# Patient Record
Sex: Female | Born: 1965 | Race: White | Hispanic: No | Smoking: Current every day smoker
Health system: Southern US, Community
[De-identification: ages and names within clinical notes are randomized; demographics above are authoritative.]

## PROBLEM LIST (undated history)

## (undated) DIAGNOSIS — B0229 Other postherpetic nervous system involvement: Secondary | ICD-10-CM

## (undated) DIAGNOSIS — G8929 Other chronic pain: Secondary | ICD-10-CM

## (undated) DIAGNOSIS — R51 Headache: Secondary | ICD-10-CM

## (undated) DIAGNOSIS — B029 Zoster without complications: Secondary | ICD-10-CM

## (undated) HISTORY — PX: SINUS SURGERY WITH INSTATRAK: SHX5215

## (undated) HISTORY — DX: Other chronic pain: G89.29

## (undated) HISTORY — PX: TONSILLECTOMY: SUR1361

## (undated) HISTORY — DX: Other postherpetic nervous system involvement: B02.29

## (undated) HISTORY — DX: Zoster without complications: B02.9

## (undated) HISTORY — DX: Headache: R51

---

## 2002-03-12 ENCOUNTER — Encounter: Admission: RE | Admit: 2002-03-12 | Discharge: 2002-03-12 | Payer: Self-pay | Admitting: Unknown Physician Specialty

## 2002-03-12 ENCOUNTER — Encounter: Payer: Self-pay | Admitting: Unknown Physician Specialty

## 2008-06-06 IMAGING — CT A/P
3 of 8 series · 12 of 42 positions shown, 18 images · IV contrast (CONTRAST)
Comparison: NONE

CLINICAL DATA: Pelvic pain. 

CT ABDOMEN AND PELVIS WITHOUT AND WITH INTRAVENOUS AND FOLLOWING 
ORAL  CONTRAST
TECHNIQUE: Multiple axial 5-millimeter thick slices at 
5-millimeter intervals were obtained from the lung base through 
the pelvis following the intravenous administration of 100 cc of 
Optiray 350 at a rate of 3 cc per second.  Oral contrast was 
administered as well.  Arterial and venous phase imaging was 
obtained in the upper abdomen with delayed images obtained through 
the pelvis.

[Series 4: venous · axial · portal-venous · 0.68mm/px · z∈[+636,+896]mm · 5 of 80 slices shown, 10 images]
[im 14/80  soft-tissue]
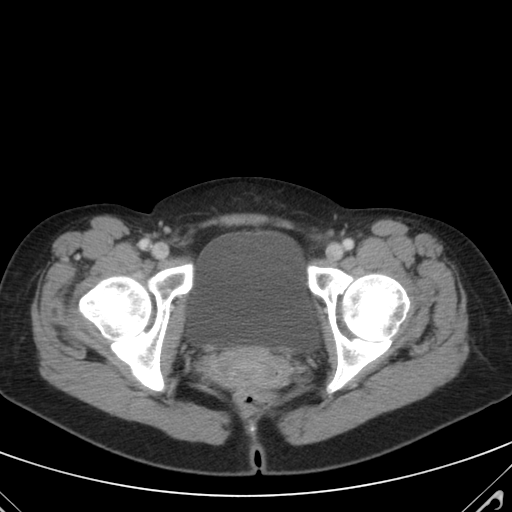
[im 14/80  bone]
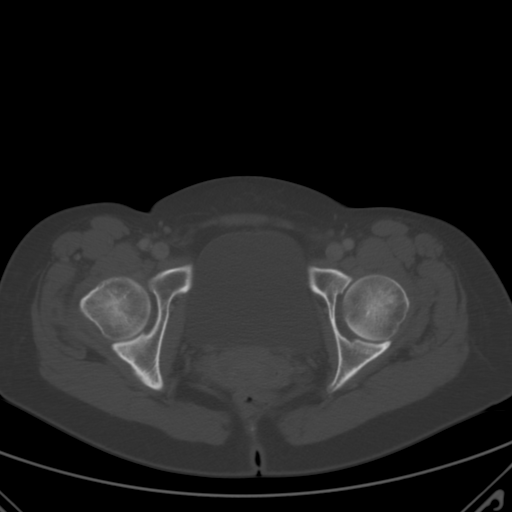
[im 27/80  soft-tissue]
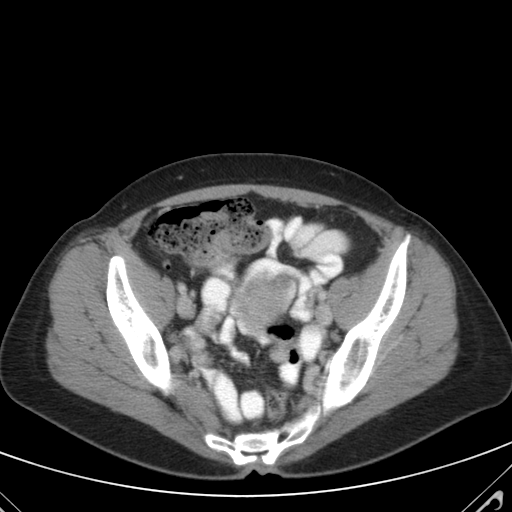
[im 27/80  lung]
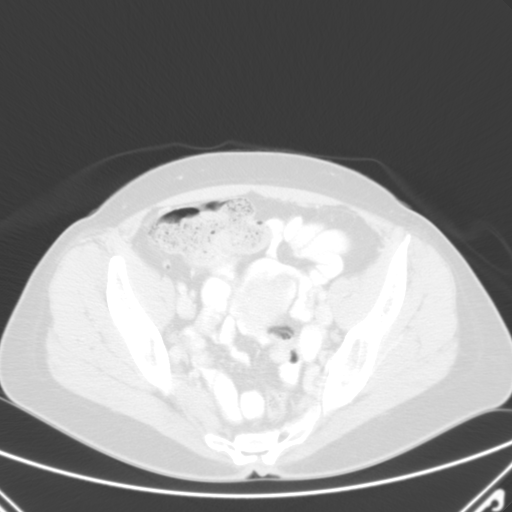
[im 40/80  soft-tissue]
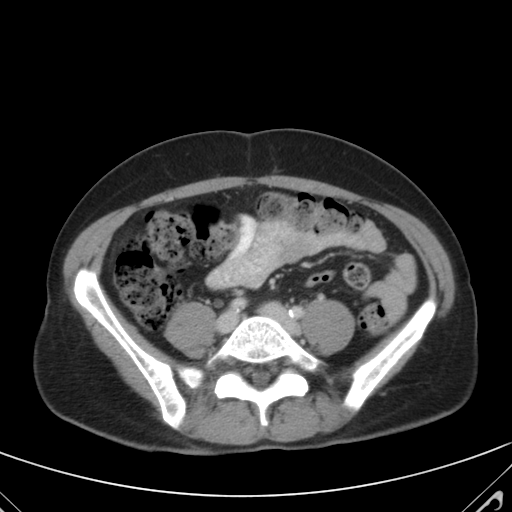
[im 40/80  lung]
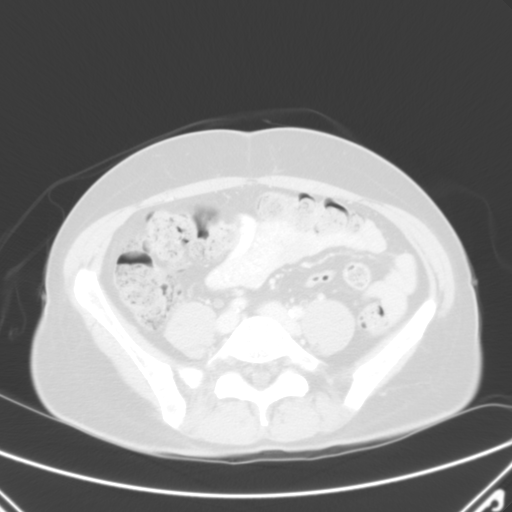
[im 53/80  soft-tissue]
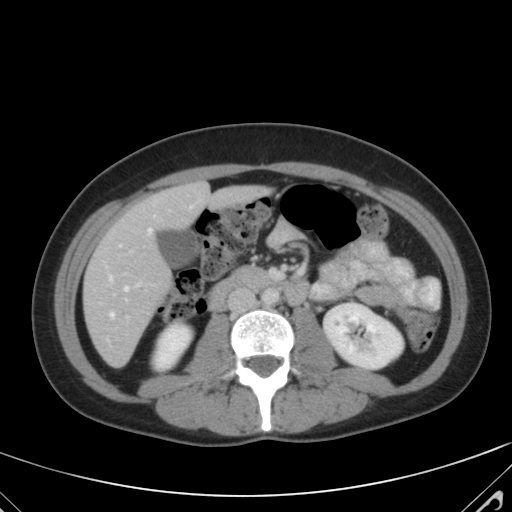
[im 53/80  lung]
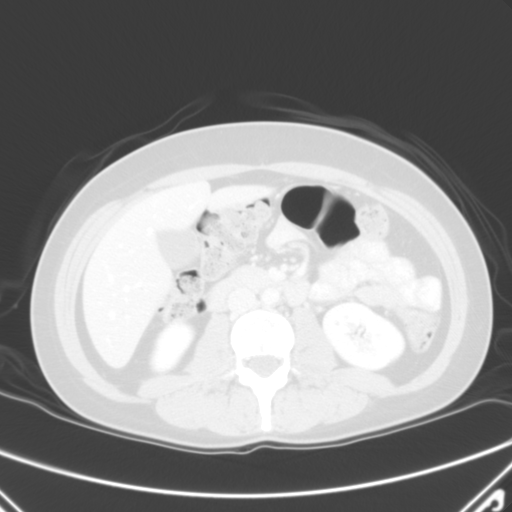
[im 66/80  soft-tissue]
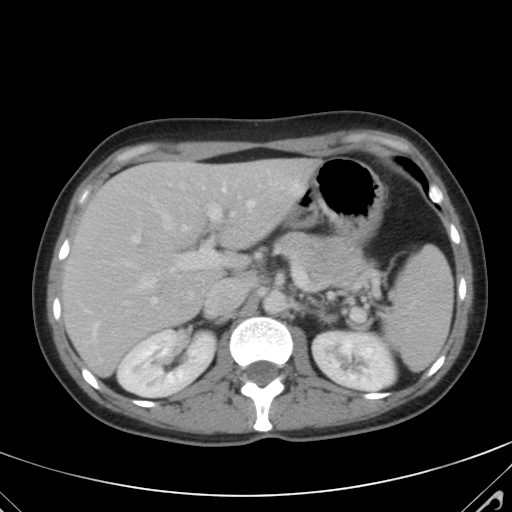
[im 66/80  lung]
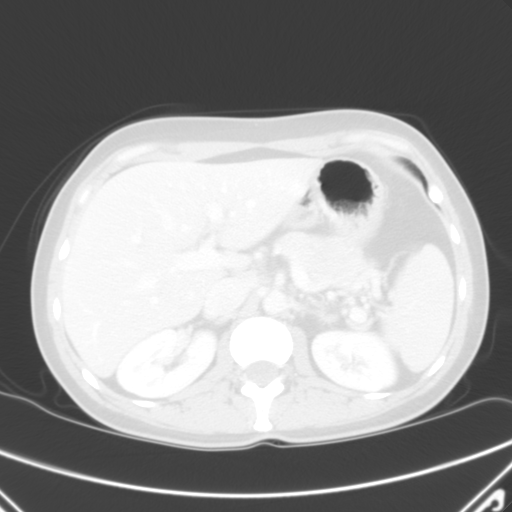

[Series 9: delays · axial · 0.68mm/px · z∈[+666,+861]mm · 4 of 67 slices shown]
[im 14/67  soft-tissue]
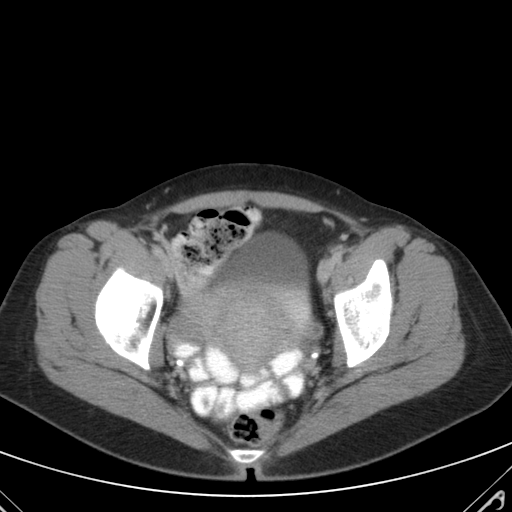
[im 27/67  soft-tissue]
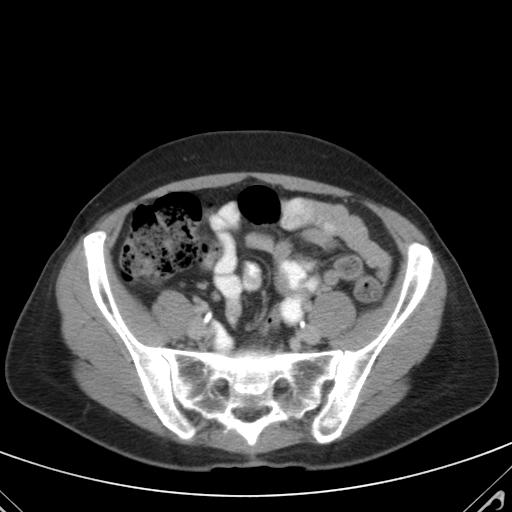
[im 40/67  soft-tissue]
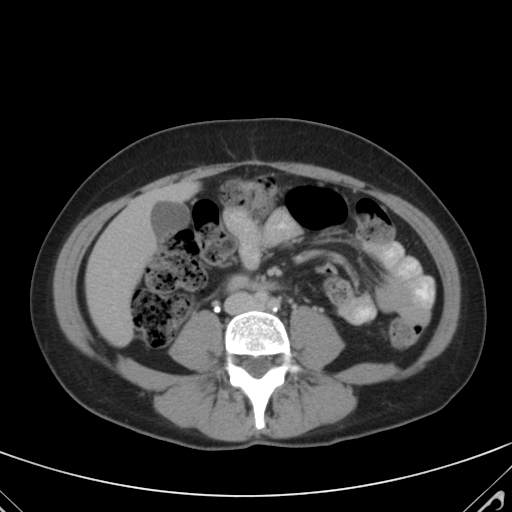
[im 53/67  soft-tissue]
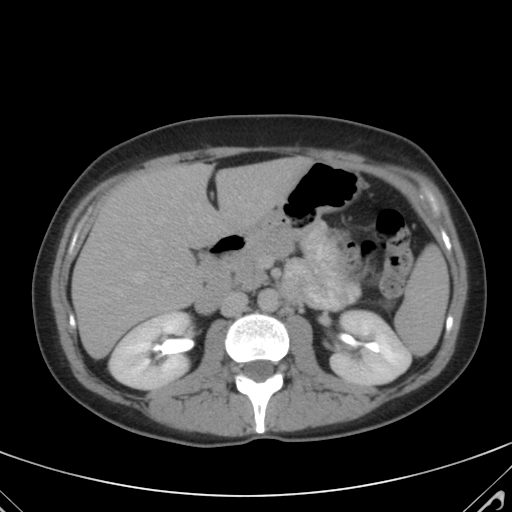

[Series 8058: coronals · coronal · 0.77mm/px · 3 of 74 slices shown, 4 images]
[im 25/74  soft-tissue]
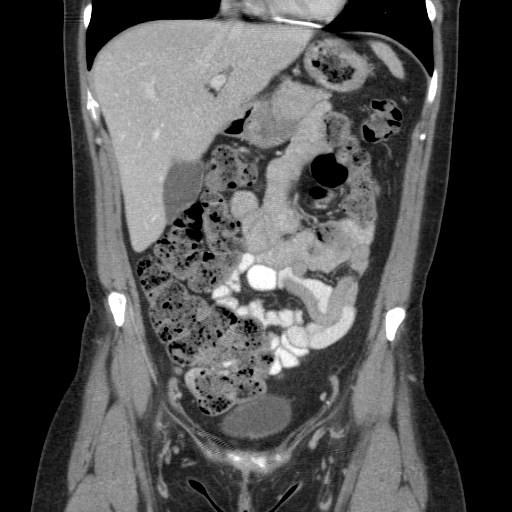
[im 33/74  soft-tissue]
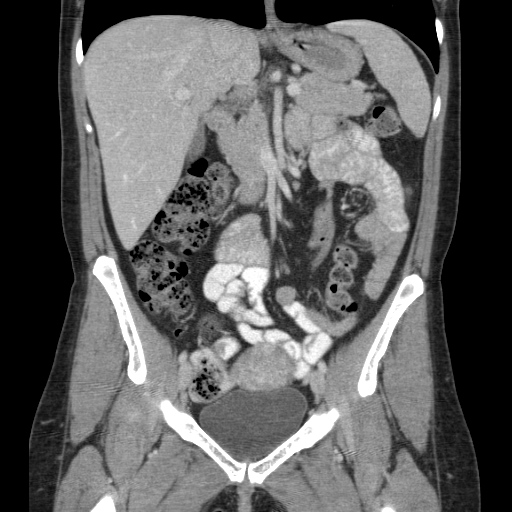
[im 33/74  bone]
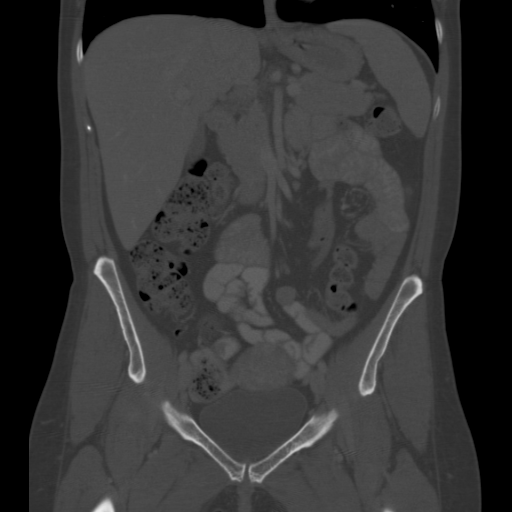
[im 41/74  soft-tissue]
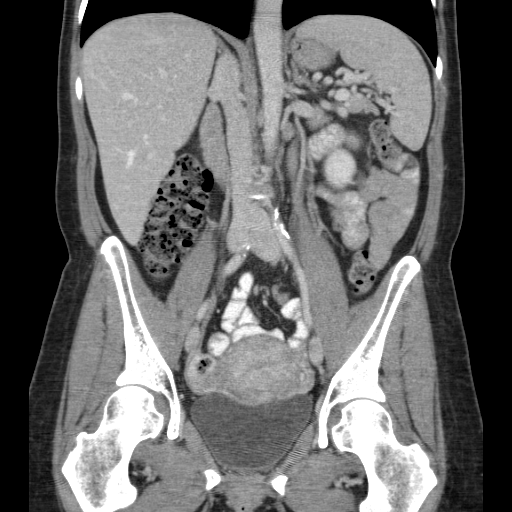

[12 of 42 positions shown; findings below may reference images not displayed]

FINDINGS: The heart size is within normal limits.  Visualized 
portions of the lung bases are within normal limits.  The liver, 
gallbladder, spleen, adrenal glands, pancreas, and kidneys are 
within normal limits.  There are no gross intraluminal masses seen 
in the bladder.  There is a hypodense mass seen in the left ovary. 
 This has a density of approximately 12 Hounsfield units.  This 
measures approximately 2.5 cm in diameter.  This is consistent 
with an ovarian cyst.  There are no gross intraluminal nodules 
seen.  Appendix demonstrates normal caliber and wall thickness. No 
free fluid.  No inflammatory changes.  No evidence of aortic 
aneurysm or abdominal hernia.  There is stool scattered throughout 
the colon.
IMPRESSION: Question constipation. Cystic left ovarian mass is 
seen.  This is likely a simple cyst.  I would consider a cystic 
neoplasm to be less likely.  I would recommend a follow-up pelvic 
ultrasound after 2 months or 2 menstrual cycles to ensure 
stability or resolution of the cyst. No inflammatory changes are 
seen in the abdomen or pelvis. Pubaltoni Jareci, M.D. Electronically 
02/07/2007 [facility]  [facility]

## 2012-12-27 ENCOUNTER — Emergency Department (HOSPITAL_COMMUNITY)
Admission: EM | Admit: 2012-12-27 | Discharge: 2012-12-27 | Disposition: A | Discharge status: Home / Self Care | Payer: Self-pay | Attending: Emergency Medicine | Admitting: Emergency Medicine

## 2012-12-27 ENCOUNTER — Emergency Department (HOSPITAL_COMMUNITY): Payer: Self-pay

## 2012-12-27 ENCOUNTER — Encounter (HOSPITAL_COMMUNITY): Payer: Self-pay | Admitting: Emergency Medicine

## 2012-12-27 DIAGNOSIS — Z79899 Other long term (current) drug therapy: Secondary | ICD-10-CM | POA: Insufficient documentation

## 2012-12-27 DIAGNOSIS — B0229 Other postherpetic nervous system involvement: Secondary | ICD-10-CM

## 2012-12-27 DIAGNOSIS — Z3202 Encounter for pregnancy test, result negative: Secondary | ICD-10-CM | POA: Insufficient documentation

## 2012-12-27 DIAGNOSIS — R079 Chest pain, unspecified: Secondary | ICD-10-CM | POA: Insufficient documentation

## 2012-12-27 DIAGNOSIS — B029 Zoster without complications: Secondary | ICD-10-CM | POA: Insufficient documentation

## 2012-12-27 LAB — CBC WITH DIFFERENTIAL/PLATELET
HCT: 42.5 % (ref 36.0–46.0)
Hemoglobin: 14.2 g/dL (ref 12.0–15.0)
Lymphocytes Relative: 26 % (ref 12–46)
Lymphs Abs: 1.6 10*3/uL (ref 0.7–4.0)
MCHC: 33.4 g/dL (ref 30.0–36.0)
Monocytes Absolute: 0.3 10*3/uL (ref 0.1–1.0)
Monocytes Relative: 5 % (ref 3–12)
Neutro Abs: 4.1 10*3/uL (ref 1.7–7.7)
WBC: 6.1 10*3/uL (ref 4.0–10.5)

## 2012-12-27 LAB — URINALYSIS, ROUTINE W REFLEX MICROSCOPIC
Bilirubin Urine: NEGATIVE
Glucose, UA: NEGATIVE mg/dL
Ketones, ur: NEGATIVE mg/dL
pH: 5 (ref 5.0–8.0)

## 2012-12-27 LAB — URINE MICROSCOPIC-ADD ON

## 2012-12-27 LAB — BASIC METABOLIC PANEL
BUN: 20 mg/dL (ref 6–23)
CO2: 24 mEq/L (ref 19–32)
Chloride: 102 mEq/L (ref 96–112)
Creatinine, Ser: 0.67 mg/dL (ref 0.50–1.10)
Glucose, Bld: 120 mg/dL — ABNORMAL HIGH (ref 70–99)

## 2012-12-27 MED ORDER — ONDANSETRON HCL 4 MG/2ML IJ SOLN
4.0000 mg | Freq: Once | INTRAMUSCULAR | Status: DC
  Filled 2012-12-27: qty 2

## 2012-12-27 MED ORDER — MORPHINE SULFATE 4 MG/ML IJ SOLN
4.0000 mg | Freq: Once | INTRAMUSCULAR | Status: DC
  Filled 2012-12-27: qty 1

## 2012-12-27 MED ORDER — HYDROCODONE-ACETAMINOPHEN 5-325 MG PO TABS
2.0000 | ORAL_TABLET | Freq: Once | ORAL | Status: AC
  Administered 2012-12-27: 2 via ORAL
  Filled 2012-12-27: qty 2

## 2012-12-27 MED ORDER — HYDROCODONE-ACETAMINOPHEN 5-325 MG PO TABS: 1.0000 | ORAL_TABLET | Freq: Four times a day (QID) | ORAL | Status: DC | PRN

## 2012-12-27 NOTE — ED Notes (Signed)
Pt c/o of right sided chest pain that started last night. States that she was diagnosed with shingles couple of weeks ago. PCP thought she should come here for concern with chest pain. Non radiating. Denies SOB dizziness, n/v/d.

## 2012-12-27 NOTE — ED Provider Notes (Signed)
History    CSN: [id number] Arrival date & time 12/27/12  1512  First MD Initiated Contact with Patient 12/27/12 [phone]     Chief Complaint  Patient presents with  . Chest Pain   (Consider location/radiation/quality/duration/timing/severity/associated sxs/prior Treatment) Patient is a 47 y.o. female presenting with chest pain. The history is provided by the patient and a relative.  Chest Pain Associated symptoms: no abdominal pain, no back pain, no fever, no headache and no shortness of breath   pt c/o left chest pain for the past several weeks. Constant. Dull, burning. Non radiating. Worse at night, at rest. No associated nv, diaphoresis or sob. Pain is not pleuritic. Pt states at onset pain/symptoms, was diagnosed w shingles in the same area. States the rash has resolved, but pain persists/constant. No acute or abrupt change in pain today, but states almost out of her ultram, which did not help pain in first place. Denies any exertional cp or discomfort.   Denies cough or sob. No abd pain. Denies leg pain or swelling, no immobility/travel/surgery, or hx dvt or pe. No family hx premature cad.    History reviewed. No pertinent past medical history. History reviewed. No pertinent past surgical history. No family history on file. History  Substance Use Topics  . Smoking status: Not on file  . Smokeless tobacco: Not on file  . Alcohol Use: Not on file   OB History   Grav Para Term Preterm Abortions TAB SAB Ect Mult Living                 Review of Systems  Constitutional: Negative for fever and chills.  HENT: Negative for neck pain.   Eyes: Negative for redness.  Respiratory: Negative for shortness of breath.   Cardiovascular: Positive for chest pain. Negative for leg swelling.  Gastrointestinal: Negative for abdominal pain.  Genitourinary: Negative for flank pain.  Musculoskeletal: Negative for back pain.  Skin: Negative for rash.  Neurological: Negative for headaches.   Hematological: Does not bruise/bleed easily.  Psychiatric/Behavioral: Negative for confusion.    Allergies  Avelox  Home Medications   Current Outpatient Rx  Name  Route  Sig  Dispense  Refill  . Aspirin-Caffeine (BC FAST PAIN RELIEF PO)   Oral   Take 1 packet by mouth every 4 (four) hours as needed (headache).         . diphenhydrAMINE (BENADRYL) 25 MG tablet   Oral   Take 25 mg by mouth at bedtime as needed for sleep.         Marland Kitchen LORazepam (ATIVAN) 0.5 MG tablet   Oral   Take 0.5 mg by mouth 2 (two) times daily as needed for anxiety.         . traMADol (ULTRAM) 50 MG tablet   Oral   Take 50 mg by mouth every 6 (six) hours as needed for pain.          BP 135/82  Pulse 74  Temp(Src) 98.4 F (36.9 C) (Oral)  Resp 14  SpO2 98% Physical Exam  Nursing note and vitals reviewed. Constitutional: She appears well-developed and well-nourished. No distress.  HENT:  Mouth/Throat: Oropharynx is clear and moist.  Eyes: Conjunctivae are normal. No scleral icterus.  Neck: Neck supple. No tracheal deviation present.  Cardiovascular: Normal rate, regular rhythm, normal heart sounds and intact distal pulses.  Exam reveals no gallop and no friction rub.   No murmur heard. Pulmonary/Chest: Effort normal and breath sounds normal. No respiratory distress. She  exhibits no tenderness.  No breast or axillary mass felt.   Abdominal: Soft. Normal appearance. She exhibits no distension. There is no tenderness.  Musculoskeletal: She exhibits no edema and no tenderness.  Neurological: She is alert.  Skin: Skin is warm and dry. No rash noted.  Psychiatric: She has a normal mood and affect.    ED Course  Procedures (including critical care time)  Results for orders placed during the hospital encounter of 12/27/12  TROPONIN I      Result Value Range   Troponin I <0.30  <0.30 ng/mL  CBC WITH DIFFERENTIAL      Result Value Range   WBC 6.1  4.0 - 10.5 K/uL   RBC 4.68  3.87 - 5.11  MIL/uL   Hemoglobin 14.2  12.0 - 15.0 g/dL   HCT 09.8  11.9 - 14.7 %   MCV 90.8  78.0 - 100.0 fL   MCH 30.3  26.0 - 34.0 pg   MCHC 33.4  30.0 - 36.0 g/dL   RDW 82.9  56.2 - 13.0 %   Platelets 198  150 - 400 K/uL   Neutrophils Relative % 67  43 - 77 %   Neutro Abs 4.1  1.7 - 7.7 K/uL   Lymphocytes Relative 26  12 - 46 %   Lymphs Abs 1.6  0.7 - 4.0 K/uL   Monocytes Relative 5  3 - 12 %   Monocytes Absolute 0.3  0.1 - 1.0 K/uL   Eosinophils Relative 1  0 - 5 %   Eosinophils Absolute 0.1  0.0 - 0.7 K/uL   Basophils Relative 1  0 - 1 %   Basophils Absolute 0.1  0.0 - 0.1 K/uL  BASIC METABOLIC PANEL      Result Value Range   Sodium 138  135 - 145 mEq/L   Potassium 3.7  3.5 - 5.1 mEq/L   Chloride 102  96 - 112 mEq/L   CO2 24  19 - 32 mEq/L   Glucose, Bld 120 (*) 70 - 99 mg/dL   BUN 20  6 - 23 mg/dL   Creatinine, Ser 8.65  0.50 - 1.10 mg/dL   Calcium 9.7  8.4 - 78.4 mg/dL   GFR calc non Af Amer >90  >90 mL/min   GFR calc Af Amer >90  >90 mL/min  URINALYSIS, ROUTINE W REFLEX MICROSCOPIC      Result Value Range   Color, Urine YELLOW  YELLOW   APPearance CLEAR  CLEAR   Specific Gravity, Urine 1.014  1.005 - 1.030   pH 5.0  5.0 - 8.0   Glucose, UA NEGATIVE  NEGATIVE mg/dL   Hgb urine dipstick SMALL (*) NEGATIVE   Bilirubin Urine NEGATIVE  NEGATIVE   Ketones, ur NEGATIVE  NEGATIVE mg/dL   Protein, ur NEGATIVE  NEGATIVE mg/dL   Urobilinogen, UA 0.2  0.0 - 1.0 mg/dL   Nitrite NEGATIVE  NEGATIVE   Leukocytes, UA NEGATIVE  NEGATIVE  URINE MICROSCOPIC-ADD ON      Result Value Range   Squamous Epithelial / LPF RARE  RARE   WBC, UA 0-2  <3 WBC/hpf   RBC / HPF 0-2  <3 RBC/hpf   Bacteria, UA RARE  RARE  POCT PREGNANCY, URINE      Result Value Range   Preg Test, Ur NEGATIVE  NEGATIVE   Dg Chest 2 View  12/27/2012   *RADIOLOGY REPORT*  Clinical Data: Chest pain  CHEST - 2 VIEW  Comparison: None.  Findings: Cardiomediastinal  silhouette is within normal limits. The lungs are clear. No  pleural effusion.  No pneumothorax.  No acute osseous abnormality.  IMPRESSION: Normal chest.   Original Report Authenticated By: Christiana Pellant, M.D.     MDM  Iv ns. Labs. Cxr.   Date: 12/27/2012  Rate: 62  Rhythm: normal sinus rhythm  QRS Axis: normal  Intervals: normal  ST/T Wave abnormalities: normal  Conduction Disutrbances:none  Narrative Interpretation:   Old EKG Reviewed: none available  Reviewed nursing notes and prior charts for additional history.   Morphine iv. zofran iv.   After constant symptoms at rest for several weeks, ecg, cxr, trop neg.  Symptoms do not appear c/w acs.  No fevers. No pleuritic pain or sob.  Pt reports onset symptoms along w dx shingles, feel most likely post shingles/neuralgia related pain.  Pt notes fam hx cad in mother, also expresses concern re pcp f/u/health maintenance/mammogram - discussed will give referral to pcp, and card.   Pt appears stable for d/c.      Suzi Roots, MD 12/27/12 [phone]

## 2013-01-01 ENCOUNTER — Telehealth (HOSPITAL_COMMUNITY): Payer: Self-pay | Admitting: *Deleted

## 2013-08-22 ENCOUNTER — Ambulatory Visit (INDEPENDENT_AMBULATORY_CARE_PROVIDER_SITE_OTHER): Payer: BC Managed Care – PPO | Admitting: Internal Medicine

## 2013-08-22 ENCOUNTER — Encounter: Payer: Self-pay | Admitting: Internal Medicine

## 2013-08-22 VITALS — BP 126/74 | HR 56 | Ht 65.0 in | Wt 134.2 lb

## 2013-08-22 DIAGNOSIS — R0789 Other chest pain: Secondary | ICD-10-CM

## 2013-08-22 DIAGNOSIS — F17200 Nicotine dependence, unspecified, uncomplicated: Secondary | ICD-10-CM

## 2013-08-22 DIAGNOSIS — J8410 Pulmonary fibrosis, unspecified: Secondary | ICD-10-CM

## 2013-08-22 NOTE — Patient Instructions (Signed)
The key is to stop smoking completely before smoking completely stops you - this is the most important aspect of your care - the e cigarettes are better than smoking  Ok to take off until Monday the 16th of Feb 2015   Try to build up the neurontin dose as per bottle instructions if you can tolerate  Please schedule a follow up office visit in 4 weeks, sooner if needed with cxr and pft then

## 2013-08-22 NOTE — Progress Notes (Signed)
   Subjective:    Patient ID: Audrey Johnston, female    DOB: Jan 21, 1966   MRN: [id number]  HPI  [age] yowf smoker with unexplained chest wall pain and mild PF on cxr referred to pulmonary clinic 08/22/13 by Dr Audrey Johnston  08/22/2013 1st [facility] office visit/ Audrey Johnston  Chief Complaint  Patient presents with  . Advice Only    Pt c/o burning in chest, especially L side X6 months.  Was dx'ed with pulm fibrosis by urgent care 2 weeks ago, wants to follow-up with pulmonary for this.    Onset was acute 6 m prior to OV  Assoc with post rash around T5/6distribution on L with burning and then spread to R side across midline anteriorly to the R breast and post cp on L resoved. This resulted in cxr suggesting PF.  However, pain in at all pleuritic and is assoc with hyperesthesia to point where can no longer wear a sports bra, better on neurontin but makes her too sleepy so only takes at night when the pain is much less severe. Not limited by breathing from desired activities    No obvious other patterns in day to day or daytime variabilty or assoc chronic cough  subjective wheeze overt sinus or hb symptoms. No unusual exp hx or h/o childhood pna/ asthma or knowledge of premature birth.  Sleeping ok without nocturnal  or early am exacerbation  of respiratory  c/o's or need for noct saba. Also denies any obvious fluctuation of symptoms with weather or environmental changes or other aggravating or alleviating factors except as outlined above   Current Medications, Allergies, Complete Past Medical History, Past Surgical History, Family History, and Social History were reviewed in [facility] electronic medical record.           Review of Systems  Constitutional: Negative for fever and unexpected weight change.  HENT: Positive for sinus pressure. Negative for congestion, dental problem, ear pain, nosebleeds, postnasal drip, rhinorrhea, sneezing, sore throat and trouble swallowing.   Eyes: Negative  for redness and itching.  Respiratory: Positive for chest tightness. Negative for cough, shortness of breath and wheezing.   Cardiovascular: Negative for palpitations and leg swelling.  Gastrointestinal: Negative for nausea and vomiting.  Genitourinary: Negative for dysuria.  Musculoskeletal: Negative for joint swelling.  Skin: Negative for rash.  Neurological: Positive for headaches.  Hematological: Does not bruise/bleed easily.  Psychiatric/Behavioral: Negative for dysphoric mood. The patient is nervous/anxious.        Objective:   Physical Exam Extremely anxious wf nad  HEENT: nl dentition, turbinates, and orophanx. Nl external ear canals without cough reflex   NECK :  without JVD/Nodes/TM/ nl carotid upstrokes bilaterally   LUNGS: no acc muscle use, clear to A and P bilaterally without cough on insp or exp maneuvers   CV:  RRR  no s3 or murmur or increase in P2, no edema   ABD:  soft and nontender with nl excursion in the supine position. No bruits or organomegaly, bowel sounds nl  MS:  warm without deformities, calf tenderness, cyanosis or clubbing  SKIN: warm and dry without lesions    NEURO:  alert, approp, no deficits       cxr 08/07/13 increased int markings, no baseline avail per rads notes but in EMR has cxr from 12/27/2012 that also shows slt prominent int markings    Assessment & Plan:

## 2013-08-23 ENCOUNTER — Encounter: Payer: Self-pay | Admitting: Internal Medicine

## 2013-08-23 DIAGNOSIS — J8410 Pulmonary fibrosis, unspecified: Secondary | ICD-10-CM | POA: Insufficient documentation

## 2013-08-23 DIAGNOSIS — R0789 Other chest pain: Secondary | ICD-10-CM | POA: Insufficient documentation

## 2013-08-23 DIAGNOSIS — F17200 Nicotine dependence, unspecified, uncomplicated: Secondary | ICD-10-CM | POA: Insufficient documentation

## 2013-08-23 NOTE — Assessment & Plan Note (Signed)
>   3 min discussion  I emphasized that although we never turn away smokers from the pulmonary clinic, we do ask that they understand that the recommendations that we make  won't work nearly as well in the presence of continued cigarette exposure.  In fact, we may very well  reach a point where we can't promise to help the patient if he/she can't quit smoking. (We can and will promise to try to help, we just can't promise what we recommend will really work)   Ok to use e cigarettes as not assoc with ILD to my knowledge, though would prefer alternative sources of nicotine or rx with chantix, defer to primary care

## 2013-08-23 NOTE — Assessment & Plan Note (Addendum)
Onset around oct 2014 with ? HZ rash > fit perfectly with post herpetic neuralgia except the original area of pain posteriorly resolved and now pain crosses over to the the R breast which doesn't fit anatomically with any dermatome but is clearly not a pulmonary issue.  rec titrate up neurontin as prev rec, perhaps using the 100 mg strength during the day until she develops cns tolerance (vs lyrica rx) but defer this to primary care's capable hands as I have no particular expertise in this area.

## 2013-08-23 NOTE — Assessment & Plan Note (Signed)
DDx for pulmonary fibrosis  includes idiopathic pulmonary fibrosis, pulmonary fibrosis associated with rheumatologic diseases (which have a relatively benign course in most cases) , adverse effect from  drugs such as chemotherapy or amiodarone exposure, nonspecific interstitial pneumonia which is typically steroid responsive, and chronic hypersensitivity pneumonitis.   In active  smokers Langerhan's Cell  Histiocyctosis (eosinophilic granuomatosis = EG),  DIP,  and Respiratory Bronchiolitis ILD also need to be considered,   Mostly likely this is EG or DIP or RBILD but can't rule out IPF -  She will need to stop smoking and regroup here in 6 weeks with pfts and comparison cxr and we'll go from there.

## 2013-09-23 ENCOUNTER — Other Ambulatory Visit: Payer: Self-pay | Admitting: Internal Medicine

## 2013-09-23 DIAGNOSIS — J8410 Pulmonary fibrosis, unspecified: Secondary | ICD-10-CM

## 2013-09-24 ENCOUNTER — Ambulatory Visit (INDEPENDENT_AMBULATORY_CARE_PROVIDER_SITE_OTHER): Payer: BC Managed Care – PPO | Admitting: Internal Medicine

## 2013-09-24 ENCOUNTER — Encounter: Payer: Self-pay | Admitting: Internal Medicine

## 2013-09-24 VITALS — BP 126/82 | HR 55 | Temp 98.2°F | Ht 65.0 in | Wt 136.0 lb

## 2013-09-24 DIAGNOSIS — R0789 Other chest pain: Secondary | ICD-10-CM

## 2013-09-24 DIAGNOSIS — J4489 Other specified chronic obstructive pulmonary disease: Secondary | ICD-10-CM

## 2013-09-24 DIAGNOSIS — J449 Chronic obstructive pulmonary disease, unspecified: Secondary | ICD-10-CM | POA: Insufficient documentation

## 2013-09-24 DIAGNOSIS — J8410 Pulmonary fibrosis, unspecified: Secondary | ICD-10-CM

## 2013-09-24 MED ORDER — PREGABALIN 100 MG PO CAPS: 100.0000 mg | ORAL_CAPSULE | Freq: Two times a day (BID) | ORAL | Status: DC

## 2013-09-24 NOTE — Patient Instructions (Addendum)
Zostrix cream 4 x daily to the area that hurts the worst  Stop gabapentin and start lyrica 100 mg twice daily x one month  And you will need referral to a pain specialist if not better   The key is to stop smoking completely before smoking completely stops you - it's cleary not too late

## 2013-09-24 NOTE — Progress Notes (Signed)
PFT done today. 

## 2013-09-24 NOTE — Progress Notes (Signed)
Subjective:    Patient ID: Audrey Johnston, female    DOB: 03/28/1966   MRN: [id number]    Brief patient profile:  [age] yowf smoker with unexplained chest wall pain and mild PF on cxr referred to pulmonary clinic 08/22/13 by Dr Gillis Endsobert Brown   History of Present Illness  08/22/2013 1st [facility] office visit/ Wert  Chief Complaint  Patient presents with  . Advice Only    Pt c/o burning in chest, especially L side X6 months.  Was dx'ed with pulm fibrosis by urgent care 2 weeks ago, wants to follow-up with pulmonary for this.    Onset was acute 6 m prior to OV  Assoc with post rash around T5/6distribution on L with burning and then spread to R side across midline anteriorly to the R breast and post cp on L resoved. This resulted in cxr suggesting PF.  However, pain in at all pleuritic and is assoc with hyperesthesia to point where can no longer wear a sports bra, better on neurontin but makes her too sleepy so only takes at night when the pain is much less severe. Not limited by breathing from desired activities. rec The key is to stop smoking completely before smoking completely stops you - this is the most important aspect of your care - the e cigarettes are better than smoking Ok to take off until Monday the 16th of Feb 2015  Try to build up the neurontin dose as per bottle instructions if you can tolerate Please schedule a follow up office visit in 4 weeks, sooner if needed with cxr and pft then   09/24/2013 f/u ov/Wert re: neurontin 300 bid / still smoking  Chief Complaint  Patient presents with  . Follow-up    With PFT results. Breathing is unchanged. Denies SOB and cough. Pt has "CP d/t shingles", pt had shingles July 2014.    neurontin helps the pain but can't tol max dose due to drowsiness Not limited by breathing from desired activities    No obvious day to day or daytime variabilty or assoc chronic cough or cp or chest tightness, subjective wheeze overt sinus or hb  symptoms. No unusual exp hx or h/o childhood pna/ asthma or knowledge of premature birth.  Sleeping ok without nocturnal  or early am exacerbation  of respiratory  c/o's or need for noct saba. Also denies any obvious fluctuation of symptoms with weather or environmental changes or other aggravating or alleviating factors except as outlined above   Current Medications, Allergies, Complete Past Medical History, Past Surgical History, Family History, and Social History were reviewed in [facility] electronic medical record.  ROS  The following are not active complaints unless bolded sore throat, dysphagia, dental problems, itching, sneezing,  nasal congestion or excess/ purulent secretions, ear ache,   fever, chills, sweats, unintended wt loss, pleuritic or exertional cp, hemoptysis,  orthopnea pnd or leg swelling, presyncope, palpitations, heartburn, abdominal pain, anorexia, nausea, vomiting, diarrhea  or change in bowel or urinary habits, change in stools or urine, dysuria,hematuria,  rash, arthralgias, visual complaints, headache, numbness weakness or ataxia or problems with walking or coordination,  change in mood/affect or memory.                        Objective:   Physical Exam Extremely anxious wf nad  Wt Readings from Last 3 Encounters:  09/24/13 136 lb (61.689 kg)  08/22/13 134 lb 3.2 oz (60.873 kg)  HEENT: nl dentition, turbinates, and orophanx. Nl external ear canals without cough reflex   NECK :  without JVD/Nodes/TM/ nl carotid upstrokes bilaterally   LUNGS: no acc muscle use, clear to A and P bilaterally without cough on insp or exp maneuvers   CV:  RRR  no s3 or murmur or increase in P2, no edema   ABD:  soft and nontender with nl excursion in the supine position. No bruits or organomegaly, bowel sounds nl  MS:  warm without deformities, calf tenderness, cyanosis or clubbing  SKIN: warm and dry without lesions    NEURO:  alert, approp, no deficits        cxr 08/07/13 increased int markings, no baseline avail per rads notes but in EMR has cxr from 12/27/2012 that also shows slt prominent int markings    Assessment & Plan:

## 2013-09-28 NOTE — Assessment & Plan Note (Addendum)
-   09/24/2013  Walked RA x 3 laps @ 185 ft each stopped due to  End of study, no desats - PFT's 09/24/13 c/w GOLD I copd with nl dlco  No evidence of sign progressive PF though she is at risk of RBILD and DIP, both almost exclusively dz of smokers

## 2013-09-28 NOTE — Assessment & Plan Note (Signed)
Onset around oct 2014 with ? HZ rash  With worse pain L lateral CW around T5 distribution rad to L breast. -  rec trial of lyrica and zostric  See instructions for specific recommendations which were reviewed directly with the patient who was given a copy with highlighter outlining the key components.

## 2013-09-28 NOTE — Assessment & Plan Note (Signed)
-   PFT's 09/24/2013 FEV1  2.47 (83%) ratio 69 p b2 and dlco 81%   I reviewed the Flethcher curve with patient that basically indicates  if you quit smoking when your best day FEV1 is still well preserved (as is clearly the case here)  it is highly unlikely you will progress to severe disease and informed the patient there was no medication on the market that has proven to change the curve or the likelihood of progression.  Therefore stopping smoking and maintaining abstinence is the most important aspect of care, not choice of inhalers or for that matter, doctors.

## 2013-10-08 LAB — PULMONARY FUNCTION TEST
DL/VA % PRED: 84 %
DL/VA: 4.17 ml/min/mmHg/L
DLCO UNC: 20.94 ml/min/mmHg
DLCO unc % pred: 81 %
FEF 25-75 PRE: 1.3 L/s
FEF 25-75 Post: 1.59 L/sec
FEF2575-%CHANGE-POST: 22 %
FEF2575-%PRED-PRE: 44 %
FEF2575-%Pred-Post: 55 %
FEV1-%Change-Post: 6 %
FEV1-%PRED-PRE: 78 %
FEV1-%Pred-Post: 83 %
FEV1-Post: 2.47 L
FEV1-Pre: 2.32 L
FEV1FVC-%Change-Post: 4 %
FEV1FVC-%Pred-Pre: 81 %
FEV6-%CHANGE-POST: 2 %
FEV6-%PRED-POST: 98 %
FEV6-%Pred-Pre: 95 %
FEV6-PRE: 3.46 L
FEV6-Post: 3.54 L
FEV6FVC-%Change-Post: 1 %
FEV6FVC-%PRED-POST: 101 %
FEV6FVC-%Pred-Pre: 99 %
FVC-%CHANGE-POST: 1 %
FVC-%PRED-POST: 96 %
FVC-%Pred-Pre: 95 %
FVC-POST: 3.58 L
FVC-Pre: 3.53 L
POST FEV1/FVC RATIO: 69 %
POST FEV6/FVC RATIO: 99 %
PRE FEV1/FVC RATIO: 66 %
Pre FEV6/FVC Ratio: 98 %
RV % pred: 128 %
RV: 2.32 L
TLC % PRED: 108 %
TLC: 5.64 L

## 2014-01-24 ENCOUNTER — Telehealth: Payer: Self-pay | Admitting: Internal Medicine

## 2014-01-24 DIAGNOSIS — B0229 Other postherpetic nervous system involvement: Secondary | ICD-10-CM

## 2014-01-24 NOTE — Telephone Encounter (Signed)
Based on my prev eval 4 m ago I did not recommend a neurology evaluation as they do not see this problem.   A pain clinic offer injection into the nerve causing the pain  If she wants to see a neurologist first that's fine but I can't do that kind of referral and honestly best to let her primary do all referring (that's what I should have suggested in the first place but was just trying to help her out)

## 2014-01-24 NOTE — Telephone Encounter (Signed)
Order was cancelled for Pain Clinic Pt states that she feels its best for her to see a Neurologist first.  Pt states that she is going to contact her PCP for this referral.  Nothing further needed at this time.

## 2014-01-24 NOTE — Telephone Encounter (Signed)
Pain clinic referral fine with me  Dx post herpetic neuralgia

## 2014-01-24 NOTE — Telephone Encounter (Signed)
lmtcb x 1 for pt Order placed for ref to pain clinic

## 2014-01-24 NOTE — Telephone Encounter (Signed)
Pt returning call can be reached @ [phone].Audrey GriffinsStanley A Johnston

## 2014-01-24 NOTE — Telephone Encounter (Signed)
Pt aware of rec's per MW Pt not sure if she wants to start our going straight to a pain clinic Pt wanting to know if she will benefit more from Neurology Referral? Pt states that she wants to make sure that there is no nerve damage before taking a bunch of unnecessary medications.  Please advise Dr Sherene SiresWert. Thanks.

## 2014-01-24 NOTE — Telephone Encounter (Signed)
Pt calling c/o of same pain in Left Breast region. States that she discussed this at last visit with MW. Was advised per MW to contact our office if no improvement. Pt mentioned a referral that would be placed if no improvement. Patient Instructions     Zostrix cream 4 x daily to the area that hurts the worst  Stop gabapentin and start lyrica 100 mg twice daily x one month And you will need referral to a pain specialist if not better  The key is to stop smoking completely before smoking completely stops you - it's cleary not too late    Please advise Dr Sherene SiresWert. Thanks.

## 2014-02-06 ENCOUNTER — Ambulatory Visit (INDEPENDENT_AMBULATORY_CARE_PROVIDER_SITE_OTHER): Payer: BC Managed Care – PPO | Admitting: Neurology

## 2014-02-06 ENCOUNTER — Encounter: Payer: Self-pay | Admitting: Neurology

## 2014-02-06 VITALS — BP 139/84 | HR 56 | Ht 65.0 in | Wt 135.0 lb

## 2014-02-06 DIAGNOSIS — B0229 Other postherpetic nervous system involvement: Secondary | ICD-10-CM

## 2014-02-06 HISTORY — DX: Other postherpetic nervous system involvement: B02.29

## 2014-02-06 MED ORDER — CARBAMAZEPINE 200 MG PO TABS: ORAL_TABLET | ORAL | Status: AC

## 2014-02-06 NOTE — Patient Instructions (Signed)

## 2014-02-06 NOTE — Progress Notes (Signed)
Reason for visit: Postherpetic neuralgia  Audrey Johnston is a 48 y.o. female  History of present illness:  Audrey Johnston is a 48 year old right-handed white female with a history of a shingles outbreak that occurred in July 2014. The patient indicated that the pain began around the left T5 distribution, but there was only a single lesion on the skin with the shingles outbreak. The patient has had persistent pain since that time that has been associated with a dull achy pain in the T5 distribution, occasionally with flareups of pain with burning and stinging. Within the last 1 week prior to this evaluation, the patient has begun to have similar discomfort on the right side at the T5 level. There has been no associated rash with this. The patient has been on Lyrica in the past, but the dose was cut back from 100 mg twice daily to 50 mg twice daily because she was unable to tolerate the medication secondary to cognitive side effects and dizziness. The patient denies any numbness or weakness of the lower extremities, and no issues controlling the bowels or the bladder, and no problems with balance. The patient is not sleeping well secondary to the pain. The patient continues to maintain gainful employment. She is sent to this office for further evaluation.   Past Medical History  Diagnosis Date  . Chronic headaches   . Shingles   . Post herpetic neuralgia 02/06/2014    Past Surgical History  Procedure Laterality Date  . Sinus surgery with instatrak      deviated septum  . Tonsillectomy      Family History  Problem Relation Age of Onset  . Diabetes Mother   . Breast cancer Mother   . Alzheimer's disease Mother   . Heart Problems Mother   . Hypertension Father     Social history:  reports that she has been smoking Cigarettes.  She has a 48 pack-year smoking history. She has never used smokeless tobacco. She reports that she does not drink alcohol or use illicit drugs.  Medications:    Current Outpatient Prescriptions on File Prior to Visit  Medication Sig Dispense Refill  . Aspirin-Caffeine (BC FAST PAIN RELIEF PO) Take 1 packet by mouth every 4 (four) hours as needed (headache).      . diphenhydrAMINE (BENADRYL) 25 MG tablet Take 25 mg by mouth at bedtime as needed for sleep.       No current facility-administered medications on file prior to visit.      Allergies  Allergen Reactions  . Avelox [Moxifloxacin Hcl In Nacl] Hives and Rash    ROS:  Out of a complete 14 system review of symptoms, the patient complains only of the following symptoms, and all other reviewed systems are negative.  Weight gain Chest pain, palpitations of the heart Dizziness Feeling hot, cold  Blood pressure 139/84, pulse 56, height  (1.651 m), weight 135 lb (61.236 kg).  Physical Exam  General: The patient is alert and cooperative at the time of the examination.  Eyes: Pupils are equal, round, and reactive to light. Discs are flat bilaterally.  Neck: The neck is supple, no carotid bruits are noted.  Respiratory: The respiratory examination is clear.  Cardiovascular: The cardiovascular examination reveals a regular rate and rhythm, no obvious murmurs or rubs are noted.  Skin: Extremities are without significant edema. No evidence of a rash is seen at the T5 level on either side.  Neurologic Exam  Mental status: The patient is  alert and oriented x 3 at the time of the examination. The patient has apparent normal recent and remote memory, with an apparently normal attention span and concentration ability.  Cranial nerves: Facial symmetry is present. There is good sensation of the face to pinprick and soft touch bilaterally. The strength of the facial muscles and the muscles to head turning and shoulder shrug are normal bilaterally. Speech is well enunciated, no aphasia or dysarthria is noted. Extraocular movements are full. Visual fields are full. The tongue is midline, and  the patient has symmetric elevation of the soft palate. No obvious hearing deficits are noted.  Motor: The motor testing reveals 5 over 5 strength of all 4 extremities. Good symmetric motor tone is noted throughout.  Sensory: Sensory testing is intact to pinprick, soft touch, vibration sensation, and position sense on all 4 extremities. No evidence of extinction is noted.  Coordination: Cerebellar testing reveals good finger-nose-finger and heel-to-shin bilaterally.  Gait and station: Gait is normal. Tandem gait is normal. Romberg is negative. No drift is seen.  Reflexes: Deep tendon reflexes are symmetric and normal bilaterally. Toes are downgoing bilaterally.   Assessment/Plan:  One. Post herpetic neuralgia  The patient has several atypical features with her current pain syndrome. The patient indicates that there was a minimal rash on the left T5 distribution, and she has had chronic pain since that time. The patient now has had pain crossing midline over the last 1 week again in the T5 distribution. There is no history and no findings on clinical examination that would support a myelopathy. The patient will be sent for MRI evaluation of the thoracic spine to rule out a structural problem that would be an etiology for her chronic pain syndrome. She will be placed on low-dose carbamazepine, and she will followup in 3 months. The patient is remain on Lyrica for now. She has been treated with gabapentin in the past, and this was not effective. A TENS unit could be tried in the future for pain control.   Marlan Palau MD 02/06/2014 8:31 PM  [facility] [address] [place], [place] [address]  Phone [phone] Fax [phone]

## 2014-04-26 IMAGING — CR DG CHEST 2V
2 series · 2 of 2 positions shown · non-contrast
Comparison: None.

CLINICAL DATA: Chest pain

CHEST - 2 VIEW

[w chest pa]
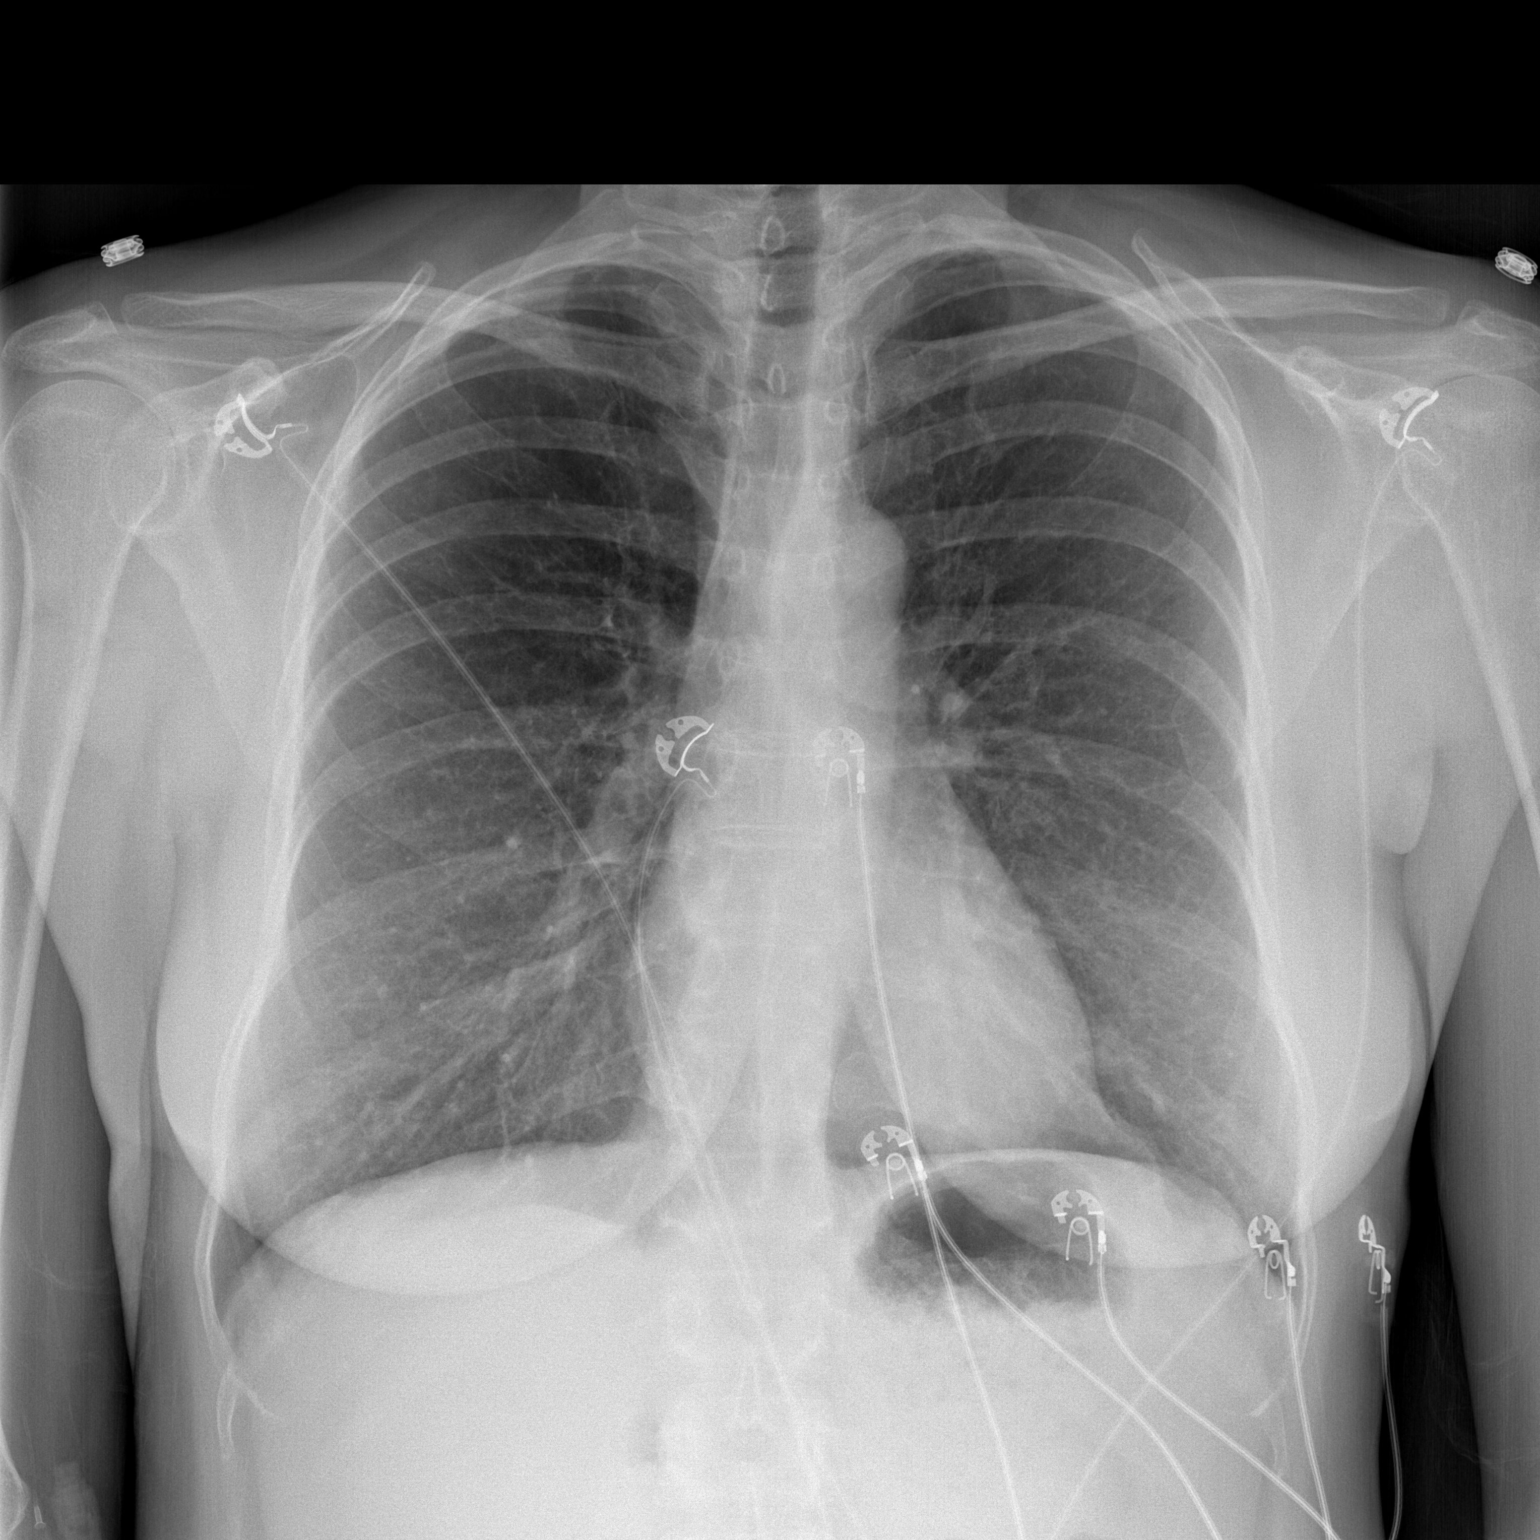

[w chest lat]
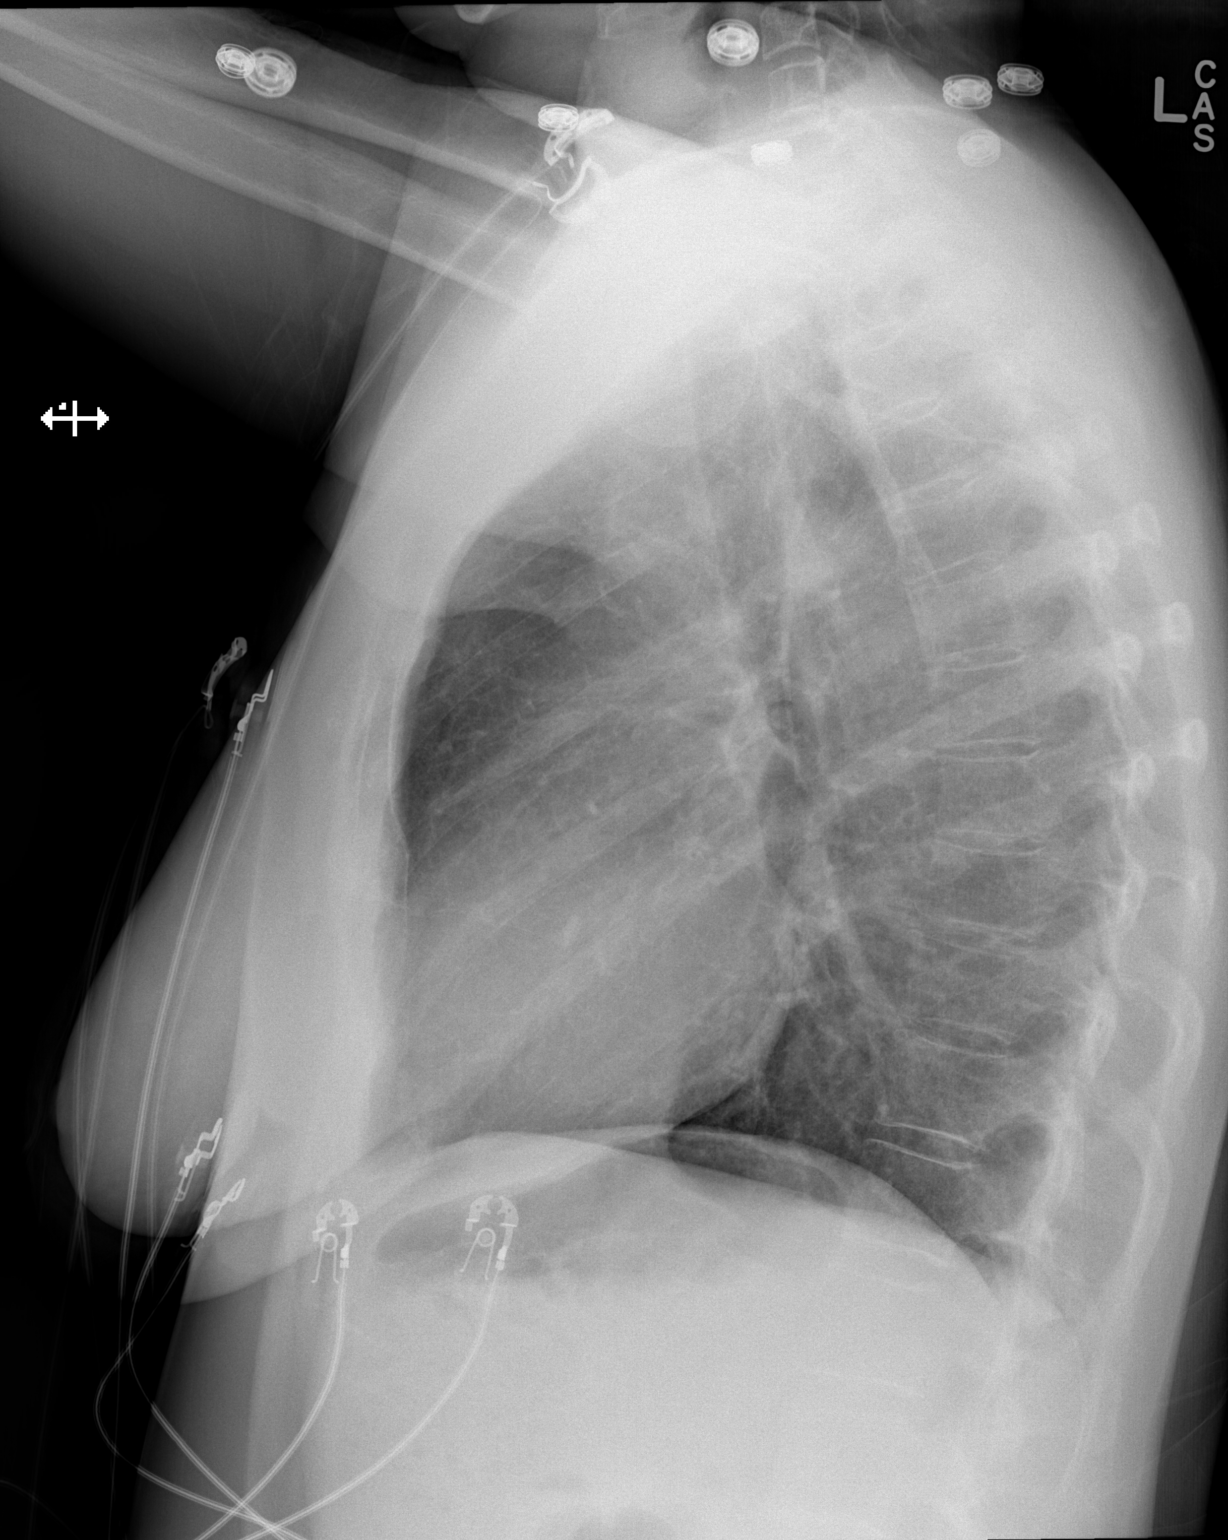

[2 of 2 positions shown; findings below may reference images not displayed]

FINDINGS: Cardiomediastinal silhouette is within normal limits. The
lungs are clear. No pleural effusion.  No pneumothorax.  No acute
osseous abnormality.
IMPRESSION: Normal chest.
